# Patient Record
Sex: Male | Born: 1995 | Race: Black or African American | Hispanic: No | Marital: Single | Smoking: Never smoker
Health system: Southern US, Community
[De-identification: ages and names within clinical notes are randomized; demographics above are authoritative.]

## PROBLEM LIST (undated history)

## (undated) DIAGNOSIS — J45909 Unspecified asthma, uncomplicated: Secondary | ICD-10-CM

---
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
## 2016-06-15 ENCOUNTER — Encounter (HOSPITAL_COMMUNITY): Payer: Self-pay | Admitting: Emergency Medicine

## 2016-06-15 ENCOUNTER — Emergency Department (HOSPITAL_COMMUNITY)
Admission: EM | Admit: 2016-06-15 | Discharge: 2016-06-15 | Disposition: A | Discharge status: Home / Self Care | Payer: Self-pay | Attending: Emergency Medicine | Admitting: Emergency Medicine

## 2016-06-15 ENCOUNTER — Emergency Department (HOSPITAL_COMMUNITY): Payer: Self-pay

## 2016-06-15 DIAGNOSIS — R109 Unspecified abdominal pain: Secondary | ICD-10-CM | POA: Insufficient documentation

## 2016-06-15 DIAGNOSIS — K122 Cellulitis and abscess of mouth: Secondary | ICD-10-CM | POA: Insufficient documentation

## 2016-06-15 DIAGNOSIS — J45909 Unspecified asthma, uncomplicated: Secondary | ICD-10-CM | POA: Insufficient documentation

## 2016-06-15 HISTORY — DX: Unspecified asthma, uncomplicated: J45.909

## 2016-06-15 LAB — CBC
HCT: 42.2 % (ref 39.0–52.0)
Hemoglobin: 14.7 g/dL (ref 13.0–17.0)
MCH: 27 pg (ref 26.0–34.0)
MCHC: 34.8 g/dL (ref 30.0–36.0)
MCV: 77.4 fL — ABNORMAL LOW (ref 78.0–100.0)
Platelets: 209 10*3/uL (ref 150–400)
RBC: 5.45 MIL/uL (ref 4.22–5.81)
RDW: 13.7 % (ref 11.5–15.5)
WBC: 18.3 10*3/uL — ABNORMAL HIGH (ref 4.0–10.5)

## 2016-06-15 LAB — BASIC METABOLIC PANEL
ANION GAP: 12 (ref 5–15)
BUN: 9 mg/dL (ref 6–20)
CO2: 24 mmol/L (ref 22–32)
Calcium: 9.3 mg/dL (ref 8.9–10.3)
Chloride: 98 mmol/L — ABNORMAL LOW (ref 101–111)
Creatinine, Ser: 0.95 mg/dL (ref 0.61–1.24)
GFR calc Af Amer: >60 mL/min (ref >60)
Glucose, Bld: 121 mg/dL — ABNORMAL HIGH (ref 65–99)
Potassium: 3.3 mmol/L — ABNORMAL LOW (ref 3.5–5.1)
SODIUM: 134 mmol/L — AB (ref 135–145)

## 2016-06-15 MED ORDER — IOPAMIDOL (ISOVUE-300) INJECTION 61%
INTRAVENOUS | Status: AC
  Administered 2016-06-15: 75 mL
  Filled 2016-06-15: qty 75

## 2016-06-15 MED ORDER — ACETAMINOPHEN 325 MG PO TABS: 650.0000 mg | ORAL_TABLET | Freq: Once | ORAL | Status: DC | PRN

## 2016-06-15 MED ORDER — SODIUM CHLORIDE 0.9 % IV SOLN
3.0000 g | Freq: Once | INTRAVENOUS | Status: AC
  Administered 2016-06-15: 3 g via INTRAVENOUS
  Filled 2016-06-15: qty 3

## 2016-06-15 MED ORDER — CLINDAMYCIN HCL 150 MG PO CAPS: 450.0000 mg | ORAL_CAPSULE | Freq: Four times a day (QID) | ORAL | 0 refills | Status: DC

## 2016-06-15 MED ORDER — CLINDAMYCIN PALMITATE HCL 75 MG/5ML PO SOLR: 300.0000 mg | Freq: Four times a day (QID) | ORAL | 0 refills | Status: AC

## 2016-06-15 MED ORDER — SODIUM CHLORIDE 0.9 % IV BOLUS (SEPSIS)
1000.0000 mL | Freq: Once | INTRAVENOUS | Status: AC
  Administered 2016-06-15: 1000 mL via INTRAVENOUS

## 2016-06-15 MED ORDER — ACETAMINOPHEN 160 MG/5ML PO SOLN
650.0000 mg | Freq: Once | ORAL | Status: AC
  Administered 2016-06-15: 650 mg via ORAL
  Filled 2016-06-15: qty 20.3

## 2016-06-15 NOTE — ED Triage Notes (Signed)
Pt presents to ED for assessment of sore throat x 2 days.  Hx of strep "almost every year of my life" as well as peritonsillar abscesses.  In triage patient states he is unable to open his mouth wide then 1 cm.  This RN unable to visualize airway.  This RN attempted to separate teeth for better visualization, meeting a lot of resistance.  Patient states he has not eaten in two days.

## 2016-06-15 NOTE — ED Notes (Signed)
Pt stable, ambulatory, states understanding of discharge instructions 

## 2016-06-15 NOTE — ED Provider Notes (Signed)
[facility] Provider Note   CSN: [id number] Arrival date & time: 06/15/16  1939     History   Chief Complaint Chief Complaint  Patient presents with  . Sore Throat    HPI Micheal Hayden is a 21 y.o. male.  HPI  21 year old male presents with sore throat and upper abdominal pain. Patient states he's had a sore throat for 2 days. He fell he get a fever but did not know the temperature. Today started having some upper abdominal pain that is also kind of like a nausea feeling. He states he's had many different episodes of tonsillitis and strep throat. Endorses a history of peritonsillar abscess as well. The pain is worse on his left side and he feels like he has an enlarged lymph node in his left neck. He is unable to open his mouth very wide but is able to drink water. No drooling. No trouble speaking or breathing. No neck stiffness. Decreased oral intake overall.  Past Medical History:  Diagnosis Date  . Asthma     Patient Active Problem List   Diagnosis Date Noted  . Atypical pneumonia 01/01/2013  . ASTHMA, INTERMITTENT 10/02/2009  . DYSURIA, PSYCHOGENIC 11/15/2007  . DYSURIA 11/15/2007  . RHINITIS, ALLERGIC 03/29/2006    History reviewed. No pertinent surgical history.     Home Medications    Prior to Admission medications   Medication Sig Start Date End Date Taking? Authorizing Provider  albuterol (PROVENTIL HFA) 108 (90 BASE) MCG/ACT inhaler Inhale 2 puffs into the lungs every 4 (four) hours as needed for wheezing. Patient not taking: Reported on 06/15/2016 01/20/11   Madolyn Frieze, Etta Quill, MD  azithromycin [facility]) 200 MG/5ML suspension Take 500mg  (12.72mL) on day one. Then take 250mg  (6.5 mL) daily for the next 4 days. Patient not taking: Reported on 06/15/2016 01/01/13   Vale Haven, MD  clindamycin (CLEOCIN) 75 MG/5ML solution Take 20 mLs (300 mg total) by mouth 4 (four) times daily. 06/15/16 06/22/16  Pricilla Loveless, MD  loratadine (CLARITIN) 10 MG tablet  Take 1 tablet (10 mg total) by mouth daily. Patient not taking: Reported on 06/15/2016 01/20/11 06/15/16  Madolyn Frieze, Etta Quill, MD    Family History History reviewed. No pertinent family history.  Social History Social History  Substance Use Topics  . Smoking status: Never Smoker  . Smokeless tobacco: Never Used  . Alcohol use Yes     Comment: socially     Allergies   Patient has no known allergies.   Review of Systems Review of Systems  Constitutional: Positive for fever.  HENT: Positive for sore throat. Negative for trouble swallowing and voice change.   Respiratory: Negative for cough and shortness of breath.   Gastrointestinal: Positive for abdominal pain. Negative for vomiting.  All other systems reviewed and are negative.    Physical Exam Updated Vital Signs BP 127/72   Pulse 96   Temp (!) 102.5 F (39.2 C) (Oral)   Resp 18   SpO2 100%   Physical Exam  Constitutional: He is oriented to person, place, and time. He appears well-developed and well-nourished. No distress.  Resting comfortably, speaks in full sentences, does not appear in significant pain  HENT:  Head: Normocephalic and atraumatic.  Right Ear: External ear normal.  Left Ear: External ear normal.  Nose: Nose normal.  Mouth/Throat: There is trismus in the jaw.  Due to trismus (patient states he physically can't open up further) the exam is quite limited. There is no  drooling. No obvious pustular drainage or swelling. No tongue swelling. Unable to view posterior oropharynx  Eyes: Right eye exhibits no discharge. Left eye exhibits no discharge.  Neck: Normal range of motion. Neck supple. No neck rigidity. Normal range of motion present.    Cardiovascular: Normal rate, regular rhythm and normal heart sounds.   Pulmonary/Chest: Effort normal and breath sounds normal.  Abdominal: Soft. He exhibits no distension. There is no tenderness.  Musculoskeletal: He exhibits no edema.  Neurological: He is alert  and oriented to person, place, and time.  Skin: Skin is warm and dry. He is not diaphoretic.  Nursing note and vitals reviewed.    ED Treatments / Results  Labs (all labs ordered are listed, but only abnormal results are displayed) Labs Reviewed  CBC - Abnormal; Notable for the following:       Result Value   WBC 18.3 (*)    MCV 77.4 (*)    All other components within normal limits  BASIC METABOLIC PANEL - Abnormal; Notable for the following:    Sodium 134 (*)    Potassium 3.3 (*)    Chloride 98 (*)    Glucose, Bld 121 (*)    All other components within normal limits    EKG  EKG Interpretation None       Radiology Ct Soft Tissue Neck W Contrast  Result Date: 06/15/2016 CLINICAL DATA:  Fever, sore throat and neck pain. EXAM: CT NECK WITH CONTRAST TECHNIQUE: Multidetector CT imaging of the neck was performed using the standard protocol following the bolus administration of intravenous contrast. CONTRAST:  <See Chart> ISOVUE-300 IOPAMIDOL (ISOVUE-300) INJECTION 61% COMPARISON:  None. FINDINGS: Pharynx and larynx: There is edema in the left retromolar trigone with central hypoattenuation in the left lateral pterygoid muscle. The hypoattenuating area and measures 37 Hounsfield units and likely indicates phlegmon/early abscess formation. There is mild rightward mass effect on the oropharynx. No airway narrowing. No discrete abnormality of the left-sided molars is identified. The nasopharynx is clear. The oropharynx and hypopharynx are normal. The epiglottis is normal. No retropharyngeal abscess, lymphadenopathy or effusion. Normal larynx. Salivary glands: The parotid, sublingual and submandibular glands are normal. No sialolithiasis or salivary ductal dilatation. Thyroid: Unremarkable Lymph nodes: Left level IIa lymph nodes measure up to 11 mm. Vascular: The major cervical vessels are normal. Limited intracranial: Normal Visualized orbits: Normal Mastoids and visualized paranasal sinuses:  Clear Skeleton: No focal osseous abnormality. Upper chest: Clear Other: None IMPRESSION: 1. Soft tissue edema within the left retromolar trigone with central hypoattenuation in the left lateral pterygoid muscle, likely phlegmon or developing abscess. No clear odontogenic source is identified, but the location is characteristic of spread of odontogenic infection. 2. No peritonsillar or retropharyngeal abscess. 3. Reactive cervical lymphadenopathy, greatest at left level IIa. Electronically Signed   By: Deatra Robinson M.D.   On: 06/15/2016 22:25    Procedures Procedures (including critical care time)  Medications Ordered in ED Medications  sodium chloride 0.9 % bolus 1,000 mL (0 mLs Intravenous Stopped 06/15/16 2239)  Ampicillin-Sulbactam (UNASYN) 3 g in sodium chloride 0.9 % 100 mL IVPB (0 g Intravenous Stopped 06/15/16 2159)  acetaminophen (TYLENOL) solution 650 mg (650 mg Oral Given 06/15/16 2111)  iopamidol (ISOVUE-300) 61 % injection (75 mLs  Contrast Given 06/15/16 2144)     Initial Impression / Assessment and Plan / ED Course  I have reviewed the triage vital signs and the nursing notes.  Pertinent labs & imaging results that were available during my  care of the patient were reviewed by me and considered in my medical decision making (see chart for details).     CT shows infection of the pterygoid with small phlegmon and possible developing abscess. He is overall well-appearing besides trismus which is likely due to the exact location of the infection. I discussed with Dr. Pollyann Kennedyosen, who at this point feels he can be discharged home as long as he is able to drink and swallow fluids and antibiotics. He is able to do this without difficulty. No drooling or trouble breathing. Discussed strict return precautions. This is probably a dental source given he states he thought he had wisdom tooth pain over the last couple days without is gone. He is advised to follow-up with his dentist which she feels like  he can do tomorrow. If not follow-up with Dr. Pollyann Kennedyosen tomorrow. Strict return precautions. Placed on clindamycin as per ENT recommendation.  Final Clinical Impressions(s) / ED Diagnoses   Final diagnoses:  Abscess or cellulitis, oral soft tissue    New Prescriptions Discharge Medication List as of 06/15/2016 11:06 PM    START taking these medications   Details  clindamycin (CLEOCIN) 75 MG/5ML solution Take 20 mLs (300 mg total) by mouth 4 (four) times daily., Starting Thu 06/15/2016, Until Thu 06/22/2016, Print         Pricilla LovelessGoldston, Terrisa Curfman, MD 06/15/16 2350

## 2016-06-15 NOTE — ED Notes (Signed)
Patient transported to CT 

## 2018-08-17 IMAGING — CT CT NECK W/ CM
4 series · 15 of 33 positions shown, 18 images · IV contrast (iopamidol)
Comparison: None.

CLINICAL DATA: Fever, sore throat and neck pain.

EXAM:
CT NECK WITH CONTRAST
TECHNIQUE: Multidetector CT imaging of the neck was performed using the
standard protocol following the bolus administration of intravenous
contrast.
CONTRAST:  <See Chart> CY61VQ-2DD IOPAMIDOL (CY61VQ-2DD) INJECTION
61%

[Series 3: neck 2.0 i31s 3 · axial · 0.46mm/px · z∈[-257,-89]mm · 5 of 127 slices shown, 7 images]
[im 22/127  soft-tissue]
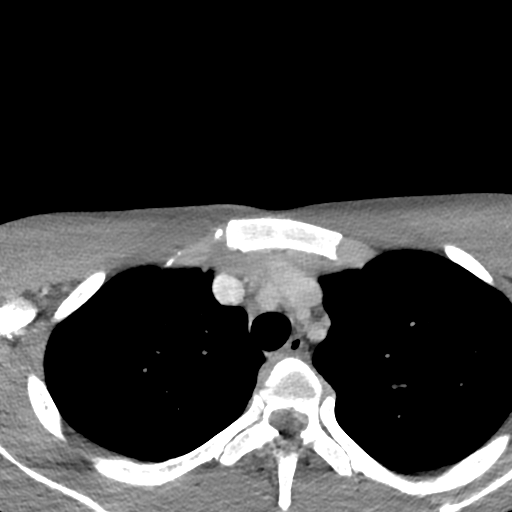
[im 22/127  bone]
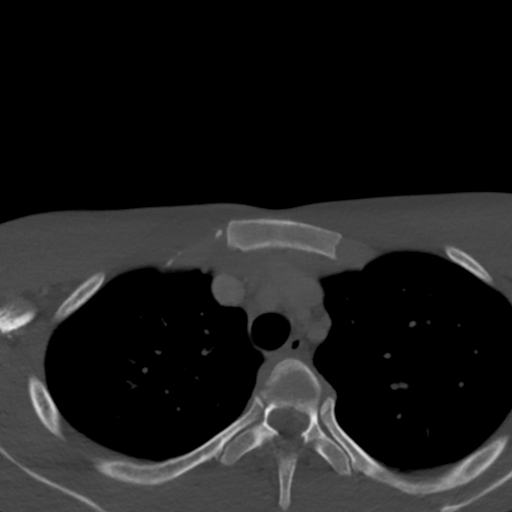
[im 43/127  bone]
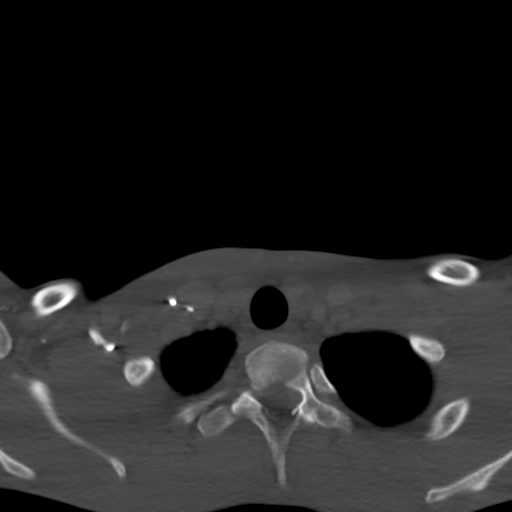
[im 64/127  bone]
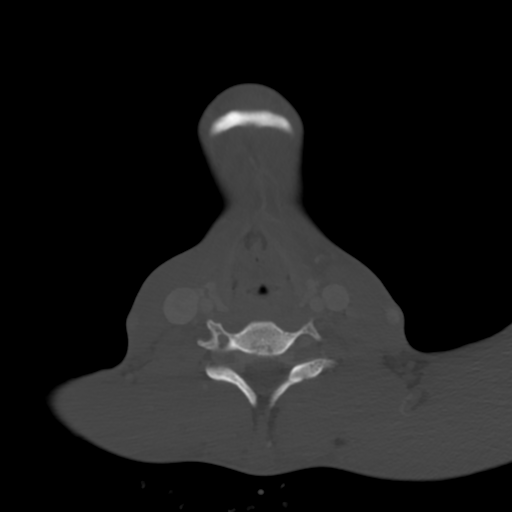
[im 85/127  bone]
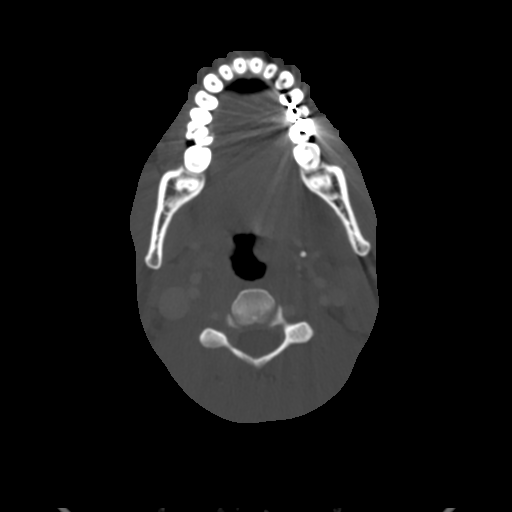
[im 106/127  soft-tissue]
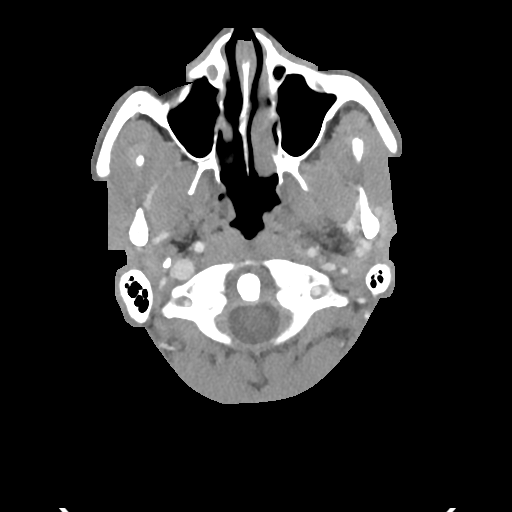
[im 106/127  bone]
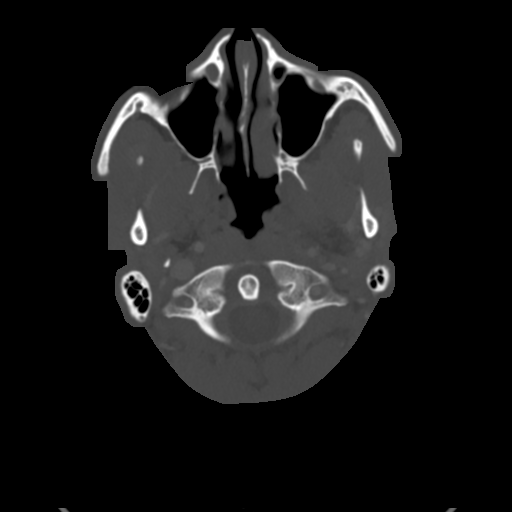

[Series 6: coronal st · coronal · 0.45mm/px · 3 of 107 slices shown]
[im 22/107  bone]
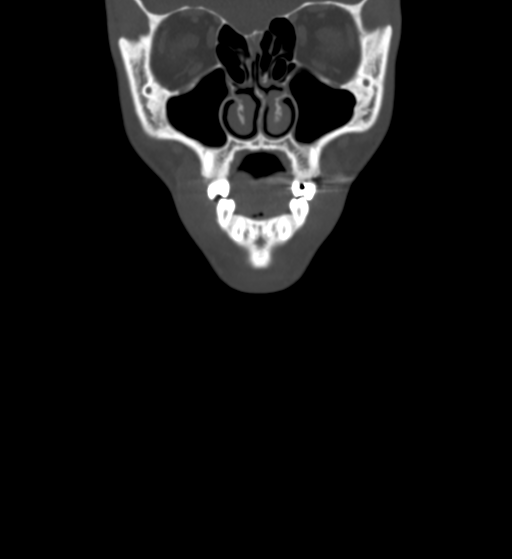
[im 43/107  bone]
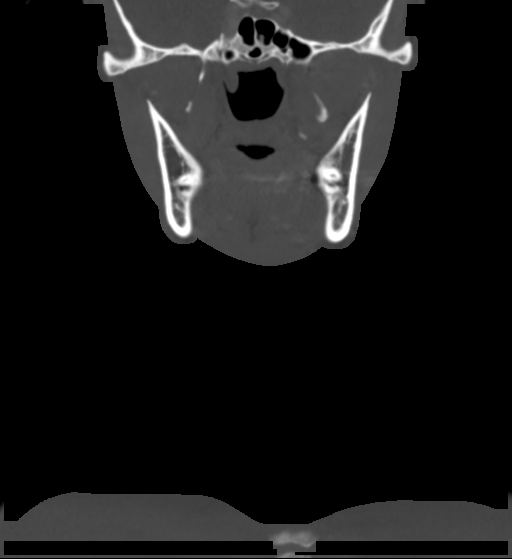
[im 64/107  bone]
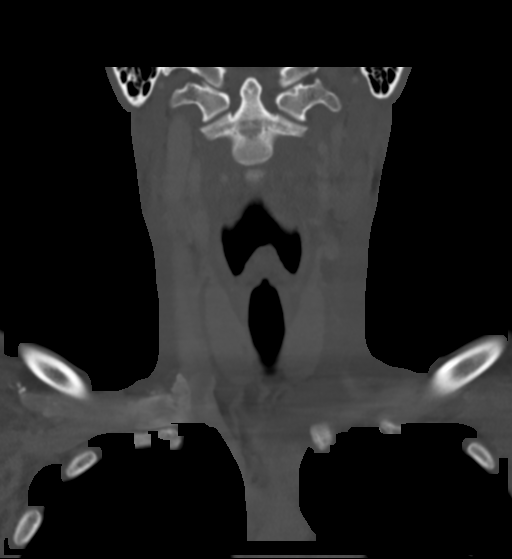

[Series 7: sagittal st · sagittal · 0.45mm/px · 5 of 101 slices shown, 6 images]
[im 34/101  bone]
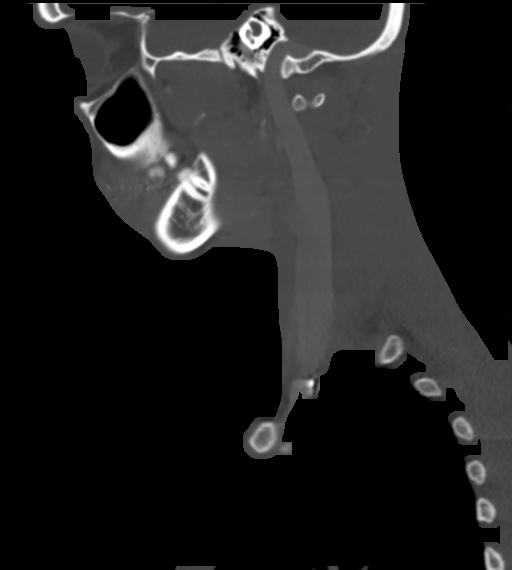
[im 42/101  bone]
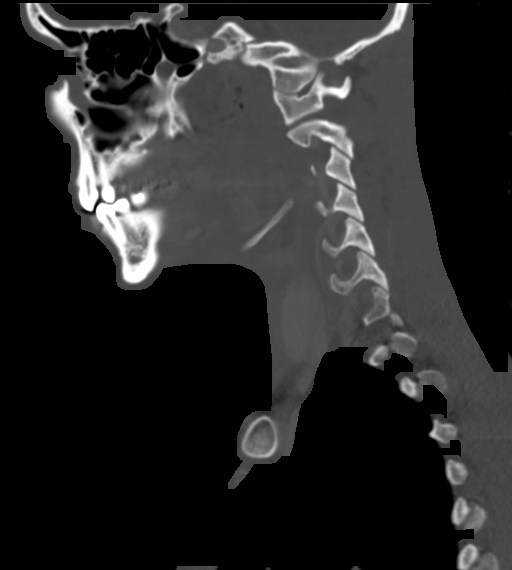
[im 51/101  soft-tissue]
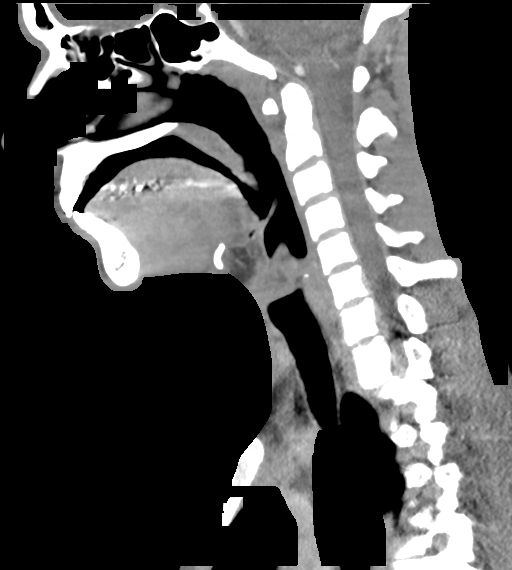
[im 51/101  bone]
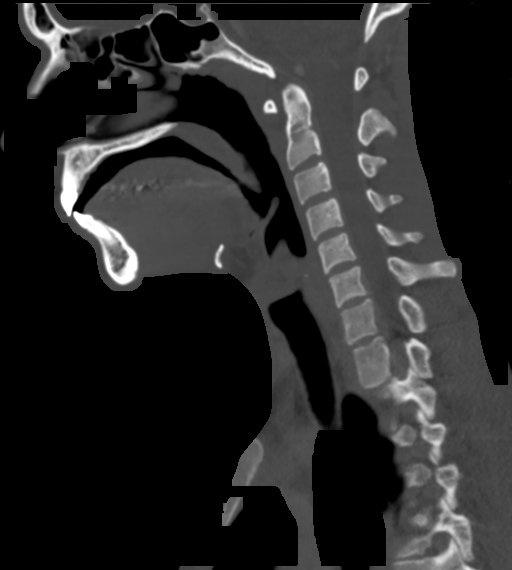
[im 59/101  bone]
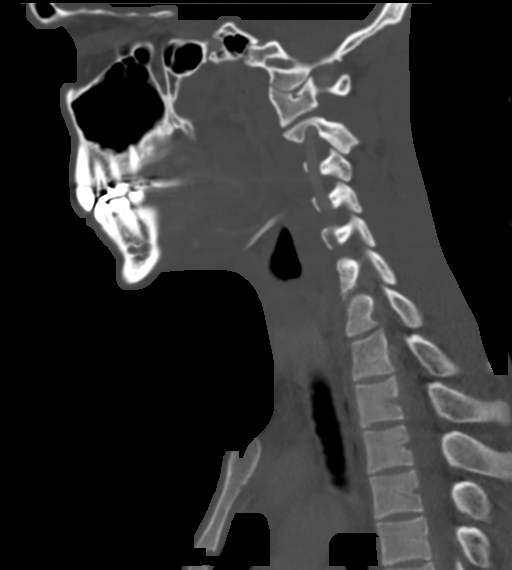
[im 67/101  bone]
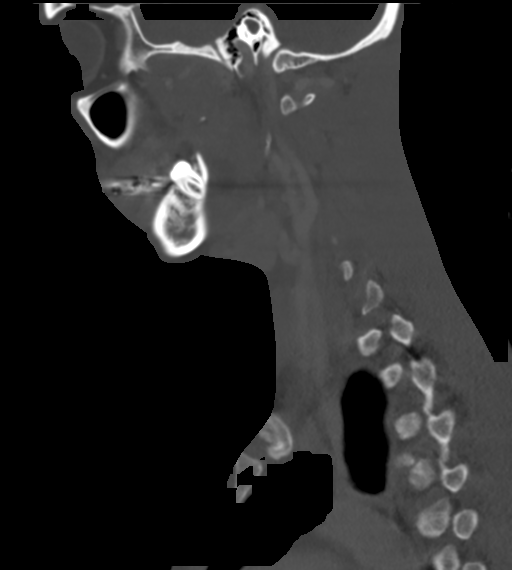

[Series 8: orthogonal st · axial · 0.39mm/px · z∈[-258,-216]mm · 2 of 126 slices shown]
[im 21/126  bone]
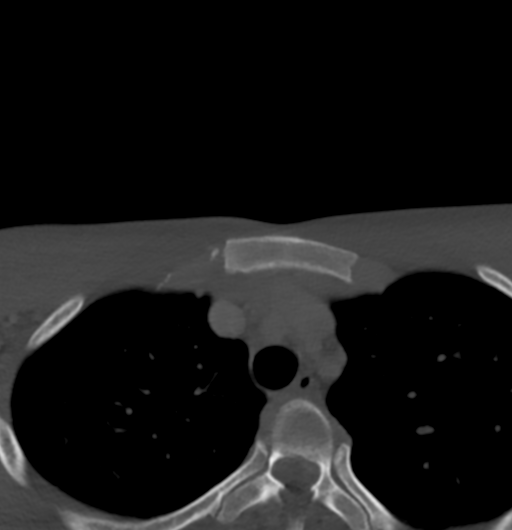
[im 42/126  bone]
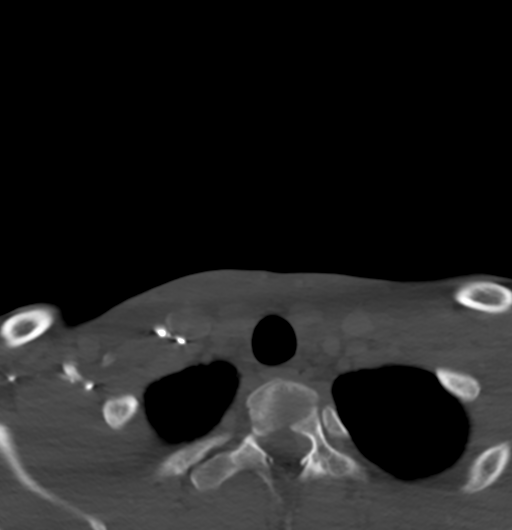

[15 of 33 positions shown; findings below may reference images not displayed]

FINDINGS: Pharynx and larynx: There is edema in the left retromolar trigone
with central hypoattenuation in the left lateral pterygoid muscle.
The hypoattenuating area and measures 37 Hounsfield units and likely
indicates phlegmon/early abscess formation. There is mild rightward
mass effect on the oropharynx. No airway narrowing. No discrete
abnormality of the left-sided molars is identified. The nasopharynx
is clear. The oropharynx and hypopharynx are normal. The epiglottis
is normal. No retropharyngeal abscess, lymphadenopathy or effusion.
Normal larynx.

Salivary glands: The parotid, sublingual and submandibular glands
are normal. No sialolithiasis or salivary ductal dilatation.

Thyroid: Unremarkable

Lymph nodes: Left level IIa lymph nodes measure up to 11 mm.

Vascular: The major cervical vessels are normal.

Limited intracranial: Normal

Visualized orbits: Normal

Mastoids and visualized paranasal sinuses: Clear

Skeleton: No focal osseous abnormality.

Upper chest: Clear

Other: None
IMPRESSION: 1. Soft tissue edema within the left retromolar trigone with central
hypoattenuation in the left lateral pterygoid muscle, likely
phlegmon or developing abscess. No clear odontogenic source is
identified, but the location is characteristic of spread of
odontogenic infection.
2. No peritonsillar or retropharyngeal abscess.
3. Reactive cervical lymphadenopathy, greatest at left level IIa.

## 2021-08-29 ENCOUNTER — Ambulatory Visit (HOSPITAL_COMMUNITY)
Admission: RE | Admit: 2021-08-29 | Discharge: 2021-08-29 | Disposition: A | Discharge status: Home / Self Care | Payer: Self-pay | Source: Ambulatory Visit | Attending: Family Medicine | Admitting: Family Medicine

## 2021-08-29 ENCOUNTER — Encounter (HOSPITAL_COMMUNITY): Payer: Self-pay

## 2021-08-29 VITALS — BP 136/83 | HR 69 | Temp 98.6°F | Resp 16

## 2021-08-29 DIAGNOSIS — Z202 Contact with and (suspected) exposure to infections with a predominantly sexual mode of transmission: Secondary | ICD-10-CM | POA: Insufficient documentation

## 2021-08-29 LAB — HIV ANTIBODY (ROUTINE TESTING W REFLEX): HIV Screen 4th Generation wRfx: NONREACTIVE

## 2021-08-29 MED ORDER — DOXYCYCLINE HYCLATE 100 MG PO CAPS: 100.0000 mg | ORAL_CAPSULE | Freq: Two times a day (BID) | ORAL | 0 refills | Status: AC

## 2021-08-29 NOTE — Discharge Instructions (Addendum)
Staff will notify you if there is anything positive on your swab or your blood test.  Take doxycycline 100 mg --1 capsule 2 times daily for 7 days

## 2021-08-29 NOTE — ED Provider Notes (Signed)
[facility]    CSN: [id number] Arrival date & time: 08/29/21  1627      History   Chief Complaint Chief Complaint  Patient presents with   Exposure to STD    HPI Micheal Hayden is a 26 y.o. male.    Exposure to STD   Here for exposure to chlamydia.  A partner let him know that he has been exposed as she tested positive for this.  He has no penile discharge, no dysuria, and no penile itching.  Also no abdominal pain or vomiting  He does want to have an HIV and RPR drawn  Past Medical History:  Diagnosis Date   Asthma     Patient Active Problem List   Diagnosis Date Noted   Atypical pneumonia 01/01/2013   ASTHMA, INTERMITTENT 10/02/2009   DYSURIA, PSYCHOGENIC 11/15/2007   DYSURIA 11/15/2007   RHINITIS, ALLERGIC 03/29/2006    History reviewed. No pertinent surgical history.     Home Medications    Prior to Admission medications   Medication Sig Start Date End Date Taking? Authorizing Provider  doxycycline (VIBRAMYCIN) 100 MG capsule Take 1 capsule (100 mg total) by mouth 2 (two) times daily for 7 days. 08/29/21 09/05/21 Yes Zenia Resides, MD    Family History History reviewed. No pertinent family history.  Social History Social History   Tobacco Use   Smoking status: Never   Smokeless tobacco: Never  Substance Use Topics   Alcohol use: Yes    Comment: socially   Drug use: Yes    Types: Marijuana     Allergies   Patient has no known allergies.   Review of Systems Review of Systems   Physical Exam Triage Vital Signs ED Triage Vitals [08/29/21 1700]  Enc Vitals Group     BP 136/83     Pulse Rate 69     Resp 16     Temp 98.6 F (37 C)     Temp Source Oral     SpO2 98 %     Weight      Height      Head Circumference      Peak Flow      Pain Score 0     Pain Loc      Pain Edu?      Excl. in GC?    No data found.  Updated Vital Signs BP 136/83 (BP Location: Right Arm)   Pulse 69   Temp 98.6 F (37 C) (Oral)    Resp 16   SpO2 98%   Visual Acuity Right Eye Distance:   Left Eye Distance:   Bilateral Distance:    Right Eye Near:   Left Eye Near:    Bilateral Near:     Physical Exam Vitals reviewed.  Constitutional:      General: He is not in acute distress.    Appearance: He is not ill-appearing, toxic-appearing or diaphoretic.  Cardiovascular:     Rate and Rhythm: Normal rate and regular rhythm.  Skin:    Coloration: Skin is not jaundiced or pale.  Neurological:     Mental Status: He is alert and oriented to person, place, and time.  Psychiatric:        Behavior: Behavior normal.      UC Treatments / Results  Labs (all labs ordered are listed, but only abnormal results are displayed) Labs Reviewed  HIV ANTIBODY (ROUTINE TESTING W REFLEX)  RPR  CYTOLOGY, (ORAL, ANAL, URETHRAL) ANCILLARY ONLY  EKG   Radiology No results found.  Procedures Procedures (including critical care time)  Medications Ordered in UC Medications - No data to display  Initial Impression / Assessment and Plan / UC Course  I have reviewed the triage vital signs and the nursing notes.  Pertinent labs & imaging results that were available during my care of the patient were reviewed by me and considered in my medical decision making (see chart for details).     He would like to go ahead and be treated empirically for the chlamydia.  We will call him with any positives and treat per protocol also. Final Clinical Impressions(s) / UC Diagnoses   Final diagnoses:  STD exposure     Discharge Instructions      Staff will notify you if there is anything positive on your swab or your blood test.  Take doxycycline 100 mg --1 capsule 2 times daily for 7 days       ED Prescriptions     Medication Sig Dispense Auth. Provider   doxycycline (VIBRAMYCIN) 100 MG capsule Take 1 capsule (100 mg total) by mouth 2 (two) times daily for 7 days. 14 capsule Marlinda Mike, Janace Aris, MD      PDMP not  reviewed this encounter.   Zenia Resides, MD 08/29/21 [phone]

## 2021-08-29 NOTE — ED Triage Notes (Signed)
Pt reports a possible exposure to Chlamydia. Denies any current symptoms at this time.

## 2021-08-30 LAB — CYTOLOGY, (ORAL, ANAL, URETHRAL) ANCILLARY ONLY
Chlamydia: POSITIVE — AB
Comment: NEGATIVE
Comment: NEGATIVE
Comment: NORMAL
Neisseria Gonorrhea: NEGATIVE
Trichomonas: NEGATIVE

## 2021-08-30 LAB — RPR: RPR Ser Ql: NONREACTIVE

## 2021-09-09 ENCOUNTER — Ambulatory Visit (HOSPITAL_COMMUNITY): Payer: Self-pay

## 2021-09-13 ENCOUNTER — Encounter (HOSPITAL_COMMUNITY): Payer: Self-pay

## 2021-09-13 ENCOUNTER — Ambulatory Visit (HOSPITAL_COMMUNITY)
Admission: RE | Admit: 2021-09-13 | Discharge: 2021-09-13 | Disposition: A | Discharge status: Home / Self Care | Payer: Self-pay | Source: Ambulatory Visit | Attending: Internal Medicine | Admitting: Internal Medicine

## 2021-09-13 VITALS — BP 116/68 | HR 77 | Temp 98.5°F | Resp 16

## 2021-09-13 DIAGNOSIS — Z202 Contact with and (suspected) exposure to infections with a predominantly sexual mode of transmission: Secondary | ICD-10-CM

## 2021-09-13 DIAGNOSIS — Z113 Encounter for screening for infections with a predominantly sexual mode of transmission: Secondary | ICD-10-CM | POA: Insufficient documentation

## 2021-09-13 NOTE — ED Provider Notes (Signed)
[facility]    CSN: [id number] Arrival date & time: 09/13/21  1733      History   Chief Complaint Chief Complaint  Patient presents with   Follow-up    HPI Micheal Hayden is a 26 y.o. male.   Patient presents to urgent care requesting test of cure after being diagnosed with chlamydia infection approximately 2 weeks ago on August 29, 2021.  Patient took antibiotics as prescribed and is now requesting test of cure to ensure he is no longer infected.  Patient plans to go to travel to [facility] and wanted to get checked prior to leaving [place].  He is no longer having any symptoms.  Denies penile rash, discharge, and itching.  No urinary symptoms reported or abdominal pain/fever or chills.  Denies recent sexual partners after positive chlamydia test or recent known exposure to STD since being treated for chlamydia.  He is otherwise without any concerns today.     Past Medical History:  Diagnosis Date   Asthma     Patient Active Problem List   Diagnosis Date Noted   Atypical pneumonia 01/01/2013   ASTHMA, INTERMITTENT 10/02/2009   DYSURIA, PSYCHOGENIC 11/15/2007   DYSURIA 11/15/2007   RHINITIS, ALLERGIC 03/29/2006    History reviewed. No pertinent surgical history.     Home Medications    Prior to Admission medications   Medication Sig Start Date End Date Taking? Authorizing Provider  doxycycline (VIBRAMYCIN) 100 MG capsule Take 1 capsule (100 mg total) by mouth 2 (two) times daily for 7 days. 09/14/21 09/21/21  Merrilee Jansky, MD    Family History History reviewed. No pertinent family history.  Social History Social History   Tobacco Use   Smoking status: Never   Smokeless tobacco: Never  Substance Use Topics   Alcohol use: Yes    Comment: socially   Drug use: Yes    Types: Marijuana     Allergies   Patient has no known allergies.   Review of Systems Review of Systems Per HPI  Physical Exam Triage Vital Signs ED  Triage Vitals  Enc Vitals Group     BP 09/13/21 1755 116/68     Pulse Rate 09/13/21 1755 77     Resp 09/13/21 1755 16     Temp 09/13/21 1755 98.5 F (36.9 C)     Temp Source 09/13/21 1755 Oral     SpO2 09/13/21 1755 96 %     Weight --      Height --      Head Circumference --      Peak Flow --      Pain Score 09/13/21 1756 0     Pain Loc --      Pain Edu? --      Excl. in GC? --    No data found.  Updated Vital Signs BP 116/68 (BP Location: Right Arm)   Pulse 77   Temp 98.5 F (36.9 C) (Oral)   Resp 16   SpO2 96%   Visual Acuity Right Eye Distance:   Left Eye Distance:   Bilateral Distance:    Right Eye Near:   Left Eye Near:    Bilateral Near:     Physical Exam Vitals and nursing note reviewed.  Constitutional:      Appearance: Normal appearance. He is not ill-appearing or toxic-appearing.     Comments: Very pleasant patient sitting on exam in position of comfort table in no acute distress.   HENT:  Head: Normocephalic and atraumatic.     Right Ear: Hearing and external ear normal.     Left Ear: Hearing and external ear normal.     Nose: Nose normal.     Mouth/Throat:     Lips: Pink.     Mouth: Mucous membranes are moist.  Eyes:     General: Lids are normal. Vision grossly intact. Gaze aligned appropriately.     Extraocular Movements: Extraocular movements intact.     Conjunctiva/sclera: Conjunctivae normal.  Pulmonary:     Effort: Pulmonary effort is normal.  Abdominal:     Palpations: Abdomen is soft.  Musculoskeletal:     Cervical back: Neck supple.  Skin:    General: Skin is warm and dry.     Capillary Refill: Capillary refill takes less than 2 seconds.     Findings: No rash.  Neurological:     General: No focal deficit present.     Mental Status: He is alert and oriented to person, place, and time. Mental status is at baseline.     Cranial Nerves: No dysarthria or facial asymmetry.     Gait: Gait is intact.  Psychiatric:        Mood and  Affect: Mood normal.        Speech: Speech normal.        Behavior: Behavior normal.        Thought Content: Thought content normal.        Judgment: Judgment normal.      UC Treatments / Results  Labs (all labs ordered are listed, but only abnormal results are displayed) Labs Reviewed  CYTOLOGY, (ORAL, ANAL, URETHRAL) ANCILLARY ONLY - Abnormal; Notable for the following components:      Result Value   Chlamydia Positive (*)    All other components within normal limits    EKG   Radiology No results found.  Procedures Procedures (including critical care time)  Medications Ordered in UC Medications - No data to display  Initial Impression / Assessment and Plan / UC Course  I have reviewed the triage vital signs and the nursing notes.  Pertinent labs & imaging results that were available during my care of the patient were reviewed by me and considered in my medical decision making (see chart for details).   1.  Screening examination for STD Test of cure sent via cytology swab of the penis.  Per up-to-date, test of cure can be performed 2 to 3 weeks after successful treatment for STI.  Patient is currently 15 days out from initial test/start of treatment.  We will notify patient of any positive results in the next 2 to 3 days on cytology swab sent off at today's visit.  He can also see his results on MyChart.  Patient agreeable with this plan.  May follow-up with urgent care as needed.  Final Clinical Impressions(s) / UC Diagnoses   Final diagnoses:  Screening examination for STD (sexually transmitted disease)     Discharge Instructions      We have sent off another STI test to see if the treatment successfully resolved your chlamydia infection.  The results will be back in 2 to 3 days.  Follow-up with urgent care as needed.     ED Prescriptions   None    PDMP not reviewed this encounter.   Carlisle Beers, [place] 09/15/21 2018

## 2021-09-13 NOTE — Discharge Instructions (Signed)
We have sent off another STI test to see if the treatment successfully resolved your chlamydia infection.  The results will be back in 2 to 3 days.  Follow-up with urgent care as needed.

## 2021-09-13 NOTE — ED Triage Notes (Signed)
Wants to be retested for an STD. Tested positive about a week and half ago.

## 2021-09-14 ENCOUNTER — Telehealth (HOSPITAL_COMMUNITY): Payer: Self-pay | Admitting: Emergency Medicine

## 2021-09-14 LAB — CYTOLOGY, (ORAL, ANAL, URETHRAL) ANCILLARY ONLY
Chlamydia: POSITIVE — AB
Comment: NEGATIVE
Comment: NEGATIVE
Comment: NORMAL
Neisseria Gonorrhea: NEGATIVE
Trichomonas: NEGATIVE

## 2021-09-14 MED ORDER — DOXYCYCLINE HYCLATE 100 MG PO CAPS: 100.0000 mg | ORAL_CAPSULE | Freq: Two times a day (BID) | ORAL | 0 refills | Status: AC

## 2021-09-19 ENCOUNTER — Ambulatory Visit (HOSPITAL_COMMUNITY)
Admission: RE | Admit: 2021-09-19 | Discharge: 2021-09-19 | Discharge status: Absent without leave | Payer: Self-pay | Source: Ambulatory Visit

## 2022-04-18 ENCOUNTER — Ambulatory Visit (HOSPITAL_COMMUNITY)
Admission: RE | Admit: 2022-04-18 | Discharge: 2022-04-18 | Disposition: A | Discharge status: Home / Self Care | Payer: Self-pay | Source: Ambulatory Visit | Attending: Internal Medicine | Admitting: Internal Medicine

## 2022-04-18 ENCOUNTER — Encounter (HOSPITAL_COMMUNITY): Payer: Self-pay

## 2022-04-18 VITALS — BP 146/86 | HR 73 | Temp 98.1°F | Resp 12

## 2022-04-18 DIAGNOSIS — J020 Streptococcal pharyngitis: Secondary | ICD-10-CM | POA: Insufficient documentation

## 2022-04-18 LAB — POCT RAPID STREP A, ED / UC: Streptococcus, Group A Screen (Direct): NEGATIVE

## 2022-04-18 MED ORDER — AMOXICILLIN 500 MG PO CAPS: 500.0000 mg | ORAL_CAPSULE | Freq: Two times a day (BID) | ORAL | 0 refills | Status: AC

## 2022-04-18 NOTE — Discharge Instructions (Signed)
Warm salt water gargle Point-of-care strep is negative Tylenol or ibuprofen as needed for pain and/or fever Please take antibiotics as directed Return to urgent care if you have further concerns We will call you with recommendations if labs are abnormal.

## 2022-04-18 NOTE — ED Triage Notes (Signed)
Pt was seen at [facility]  last week and was treated for strep, pt states he finish antibiotics , but still having pain in the throat .

## 2022-04-20 LAB — CULTURE, GROUP A STREP (THRC)

## 2022-04-20 NOTE — ED Provider Notes (Signed)
[place]    CSN: [id number] Arrival date & time: 04/18/22  [id number]      History   Chief Complaint Chief Complaint  Patient presents with   Sore Throat    HPI Micheal Hayden is a 27 y.o. male comes to urgent care with persistent sore throat.  Patient was diagnosed with strep pharyngitis about a week ago.  He was prescribed penicillin.  Patient completed 4 to 5 days of antibiotics.  It appears that he was not taking it as prescribed.  His symptoms got better and is worsening as a visit to the urgent care.  He denies any fever, chills, nausea or vomiting.  He has some difficulty/pain with swallowing.  No dizziness, near syncope or syncopal episodes.  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 No pertinent surgical history.     Home Medications    Prior to Admission medications   Medication Sig Start Date End Date Taking? Authorizing Provider  amoxicillin (AMOXIL) 500 MG capsule Take 1 capsule (500 mg total) by mouth 2 (two) times daily for 7 days. 04/18/22 04/25/22 Yes Garion Wempe, Myrene Galas, MD    Family History History reviewed. No pertinent family history.  Social History Social History   Tobacco Use   Smoking status: Never   Smokeless tobacco: Never  Substance Use Topics   Alcohol use: Yes    Comment: socially   Drug use: Yes    Types: Marijuana     Allergies   Patient has no known allergies.   Review of Systems Review of Systems As per HPI  Physical Exam Triage Vital Signs ED Triage Vitals  Enc Vitals Group     BP 04/18/22 0914 (!) 146/86     Pulse Rate 04/18/22 0914 73     Resp 04/18/22 0914 12     Temp 04/18/22 0914 98.1 F (36.7 C)     Temp Source  04/18/22 0914 Oral     SpO2 04/18/22 0914 97 %     Weight --      Height --      Head Circumference --      Peak Flow --      Pain Score 04/18/22 0912 5     Pain Loc --      Pain Edu? --      Excl. in [place]? --    No data found.  Updated Vital Signs BP (!) 146/86 (BP Location: Left Arm)   Pulse 73   Temp 98.1 F (36.7 C) (Oral)   Resp 12   SpO2 97%   Visual Acuity Right Eye Distance:   Left Eye Distance:   Bilateral Distance:    Right Eye Near:   Left Eye Near:    Bilateral Near:     Physical Exam Vitals and nursing note reviewed.  Constitutional:      General: He is not in acute distress.    Appearance: He is not ill-appearing.  HENT:     Right Ear: Tympanic membrane normal.     Left Ear: Tympanic membrane normal.     Mouth/Throat:     Mouth: Mucous membranes are moist.     Pharynx: Posterior oropharyngeal  erythema present.     Tonsils: Tonsillar exudate present. 1+ on the right. 1+ on the left.  Cardiovascular:     Rate and Rhythm: Normal rate and regular rhythm.  Pulmonary:     Effort: Pulmonary effort is normal.     Breath sounds: Normal breath sounds.  Neurological:     Mental Status: He is alert.      UC Treatments / Results  Labs (all labs ordered are listed, but only abnormal results are displayed) Labs Reviewed  CULTURE, GROUP A STREP [facility])  POCT RAPID STREP A, ED / UC    EKG   Radiology No results found.  Procedures Procedures (including critical care time)  Medications Ordered in UC Medications - No data to display  Initial Impression / Assessment and Plan / UC Course  I have reviewed the triage vital signs and the nursing notes.  Pertinent labs & imaging results that were available during my care of the patient were reviewed by me and considered in my medical decision making (see chart for details).     1.  Streptococcal pharyngitis (patient did not complete prior course of antibiotics): Point-of-care strep test is  negative Throat culture is have been sent Amoxicillin 500 mg twice daily for 7 days to make a total of 10 days of antibiotics Warm salt water gargle Chloraseptic throat spray Maintain adequate hydration Return precautions given. Final Clinical Impressions(s) / UC Diagnoses   Final diagnoses:  Strep pharyngitis     Discharge Instructions      Warm salt water gargle Point-of-care strep is negative Tylenol or ibuprofen as needed for pain and/or fever Please take antibiotics as directed Return to urgent care if you have further concerns We will call you with recommendations if labs are abnormal.   ED Prescriptions     Medication Sig Dispense Auth. Provider   amoxicillin (AMOXIL) 500 MG capsule Take 1 capsule (500 mg total) by mouth 2 (two) times daily for 7 days. 14 capsule Taneika Choi, Myrene Galas, MD      PDMP not reviewed this encounter.   Chase Picket, MD 04/20/22 1500
